# Patient Record
Sex: Male | Born: 2000 | Race: Black or African American | Hispanic: No | Marital: Single | Smoking: Current every day smoker
Health system: Southern US, Community
[De-identification: ages and names within clinical notes are randomized; demographics above are authoritative.]

## PROBLEM LIST (undated history)

## (undated) DIAGNOSIS — J45909 Unspecified asthma, uncomplicated: Secondary | ICD-10-CM

## (undated) HISTORY — DX: Unspecified asthma, uncomplicated: J45.909

---
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
## 2020-08-19 ENCOUNTER — Other Ambulatory Visit: Payer: Self-pay

## 2020-08-19 DIAGNOSIS — R110 Nausea: Secondary | ICD-10-CM | POA: Insufficient documentation

## 2020-08-19 DIAGNOSIS — R1010 Upper abdominal pain, unspecified: Secondary | ICD-10-CM | POA: Diagnosis not present

## 2020-08-19 DIAGNOSIS — F17200 Nicotine dependence, unspecified, uncomplicated: Secondary | ICD-10-CM | POA: Insufficient documentation

## 2020-08-19 LAB — COMPREHENSIVE METABOLIC PANEL
ALT: 16 U/L (ref 0–44)
AST: 27 U/L (ref 15–41)
Albumin: 4.3 g/dL (ref 3.5–5.0)
Alkaline Phosphatase: 41 U/L (ref 38–126)
Anion gap: 7 (ref 5–15)
BUN: 15 mg/dL (ref 6–20)
CO2: 27 mmol/L (ref 22–32)
Calcium: 8.9 mg/dL (ref 8.9–10.3)
Chloride: 106 mmol/L (ref 98–111)
Creatinine, Ser: 1.05 mg/dL (ref 0.61–1.24)
GFR, Estimated: >60 mL/min (ref >60)
Glucose, Bld: 87 mg/dL (ref 70–99)
Potassium: 3.5 mmol/L (ref 3.5–5.1)
Sodium: 140 mmol/L (ref 135–145)
Total Bilirubin: 1 mg/dL (ref 0.3–1.2)
Total Protein: 7.3 g/dL (ref 6.5–8.1)

## 2020-08-19 LAB — CBC
HCT: 41.6 % (ref 39.0–52.0)
Hemoglobin: 14.4 g/dL (ref 13.0–17.0)
MCH: 31.6 pg (ref 26.0–34.0)
MCHC: 34.6 g/dL (ref 30.0–36.0)
MCV: 91.4 fL (ref 80.0–100.0)
Platelets: 199 10*3/uL (ref 150–400)
RBC: 4.55 MIL/uL (ref 4.22–5.81)
RDW: 11.5 % (ref 11.5–15.5)
WBC: 5 10*3/uL (ref 4.0–10.5)
nRBC: 0 % (ref 0.0–0.2)

## 2020-08-19 LAB — LIPASE, BLOOD: Lipase: 53 U/L — ABNORMAL HIGH (ref 11–51)

## 2020-08-19 NOTE — ED Triage Notes (Signed)
C/o upper sharp intermittent abd pain x2days, denies n/v with loose stool, 1 dark stool today. Reports hx hemorrhoids that feels similar to this pain.

## 2020-08-20 ENCOUNTER — Emergency Department: Payer: Managed Care, Other (non HMO)

## 2020-08-20 ENCOUNTER — Emergency Department
Admission: EM | Admit: 2020-08-20 | Discharge: 2020-08-20 | Discharge status: Against Medical Advice | Payer: Managed Care, Other (non HMO) | Attending: Emergency Medicine | Admitting: Emergency Medicine

## 2020-08-20 DIAGNOSIS — R1010 Upper abdominal pain, unspecified: Secondary | ICD-10-CM

## 2020-08-20 LAB — URINALYSIS, COMPLETE (UACMP) WITH MICROSCOPIC
Bacteria, UA: NONE SEEN
Bilirubin Urine: NEGATIVE
Glucose, UA: NEGATIVE mg/dL
Hgb urine dipstick: NEGATIVE
Ketones, ur: 5 mg/dL — AB
Leukocytes,Ua: NEGATIVE
Nitrite: NEGATIVE
Protein, ur: NEGATIVE mg/dL
Specific Gravity, Urine: 1.026 (ref 1.005–1.030)
Squamous Epithelial / LPF: NONE SEEN (ref 0–5)
pH: 6 (ref 5.0–8.0)

## 2020-08-20 NOTE — ED Notes (Signed)
Received call from par that pt eloped after registration, provider aware

## 2020-08-20 NOTE — ED Notes (Signed)
Pt updated on wait. Pt verbalizes understanding.  

## 2020-08-20 NOTE — ED Provider Notes (Signed)
[facility] Emergency Department Provider Note  ____________________________________________   Event Date/Time   First MD Initiated Contact with Patient 08/20/20 0110     (approximate)  I have reviewed the triage vital signs and the nursing notes.   HISTORY  Chief Complaint Abdominal Pain    HPI Peter Savage is a 20 y.o. male with no significant past medical history who presents to the emergency department with 2 days of intermittent sharp upper abdominal pain without radiation and without aggravating or alleviating factors.  He reports nausea but no fevers, vomiting, diarrhea.  Did have 1 black stool yesterday but none since.  No bloody stools.  No history of abdominal surgery.  Does not drink alcohol.  Does use NSAIDs occasionally.  Has not been taking Pepto-Bismol.  No dysuria, hematuria, penile discharge.  Denies having any pain currently.        No past medical history on file.  There are no problems to display for this patient.   No past surgical history on file.  Prior to Admission medications   Not on File    Allergies Patient has no known allergies.  Family History  Problem Relation Age of Onset  . Healthy Mother   . Diabetes Father   . Heart attack Maternal Grandmother   . Hypertension Maternal Grandmother   . Thyroid disease Maternal Grandfather   . Hypertension Maternal Grandfather     Social History Social History   Tobacco Use  . Smoking status: Current Every Day Smoker    Last attempt to quit: 07/29/2017    Years since quitting: 3.0  . Smokeless tobacco: Never Used  . Tobacco comment: smoked for 7 months  Vaping Use  . Vaping Use: Every day  Substance Use Topics  . Alcohol use: Never  . Drug use: Yes    Types: Marijuana    Review of Systems Constitutional: No fever. Eyes: No visual changes. ENT: No sore throat. Cardiovascular: Denies chest pain. Respiratory: Denies shortness of  breath. Gastrointestinal: No vomiting, diarrhea. Genitourinary: Negative for dysuria. Musculoskeletal: Negative for back pain. Skin: Negative for rash. Neurological: Negative for focal weakness or numbness.  ____________________________________________   PHYSICAL EXAM:  VITAL SIGNS: ED Triage Vitals [08/19/20 2014]  Enc Vitals Group     BP (!) 135/112     Pulse Rate (!) 55     Resp 16     Temp 98.3 F (36.8 C)     Temp Source Oral     SpO2 100 %     Weight 170 lb (77.1 kg)     Height 5\' 8"  (1.727 m)     Head Circumference      Peak Flow      Pain Score 7     Pain Loc      Pain Edu?      Excl. in GC?    CONSTITUTIONAL: Alert and oriented and responds appropriately to questions. Well-appearing; well-nourished HEAD: Normocephalic EYES: Conjunctivae clear, pupils appear equal, EOM appear intact ENT: normal nose; moist mucous membranes NECK: Supple, normal ROM CARD: RRR; S1 and S2 appreciated; no murmurs, no clicks, no rubs, no gallops RESP: Normal chest excursion without splinting or tachypnea; breath sounds clear and equal bilaterally; no wheezes, no rhonchi, no rales, no hypoxia or respiratory distress, speaking full sentences ABD/GI: Normal bowel sounds; non-distended; soft, non-tender, no rebound, no guarding, no peritoneal signs, no hepatosplenomegaly, negative Murphy sign but points to his right upper quadrant as the area that his pain  was located RECTAL:  Normal rectal tone, no gross blood or melena, guaiac NEGATIVE, no hemorrhoids appreciated, nontender rectal exam, no fecal impaction. Chaperone present. BACK: The back appears normal EXT: Normal ROM in all joints; no deformity noted, no edema; no cyanosis SKIN: Normal color for age and race; warm; no rash on exposed skin NEURO: Moves all extremities equally PSYCH: The patient's mood and manner are appropriate.  ____________________________________________   LABS (all labs ordered are listed, but only abnormal  results are displayed)  Labs Reviewed  LIPASE, BLOOD - Abnormal; Notable for the following components:      Result Value   Lipase 53 (*)    All other components within normal limits  URINALYSIS, COMPLETE (UACMP) WITH MICROSCOPIC - Abnormal; Notable for the following components:   Color, Urine YELLOW (*)    APPearance CLEAR (*)    Ketones, ur 5 (*)    All other components within normal limits  COMPREHENSIVE METABOLIC PANEL  CBC   ____________________________________________  EKG  none ____________________________________________  RADIOLOGY I, Nyomie Ehrlich, personally viewed and evaluated these images (plain radiographs) as part of my medical decision making, as well as reviewing the written report by the radiologist.  ED MD interpretation: Right upper quadrant ultrasound normal.  Official radiology report(s): US Abdomen Limited RUQ (LIVER/GB)  Result Date: 08/20/2020 CLINICAL DATA:  Upper abdominal pain EXAM: ULTRASOUND ABDOMEN LIMITED RIGHT UPPER QUADRANT COMPARISON:  None. FINDINGS: Gallbladder: No gallstones or wall thickening visualized. No sonographic Murphy sign noted by sonographer. Common bile duct: Diameter: Normal caliber, 3 mm Liver: No focal lesion identified. Within normal limits in parenchymal echogenicity. Portal vein is patent on color Doppler imaging with normal direction of blood flow towards the liver. Other: None. IMPRESSION: Normal right upper quadrant ultrasound. Electronically Signed   By: Charlett Nose M.D.   On: 08/20/2020 02:03    ____________________________________________   PROCEDURES  Procedure(s) performed (including Critical Care):  Procedures   ____________________________________________   INITIAL IMPRESSION / ASSESSMENT AND PLAN / ED COURSE  As part of my medical decision making, I reviewed the following data within the electronic MEDICAL RECORD NUMBER Nursing notes reviewed and incorporated, Labs reviewed , Old chart reviewed and Notes from  prior ED visits         Patient here with upper abdominal pain.  Differential includes GERD, gastritis, gastric ulcer, GI bleed, H. pylori, biliary colic, cholecystitis, cholangitis, choledocholithiasis, pancreatitis.  Doubt appendicitis.  Labs obtained in triage are reassuring.  Very minimally elevated lipase but normal LFTs.  No leukocytosis.  Urine pending.  Will obtain right upper quadrant ultrasound.  He declines any medications at this time.  He is guaiac negative on exam with brown appearing stool.  ED PROGRESS  Patient's ultrasound was normal.  I went back to reevaluate patient and discuss his test results with him and staff states that he eloped after being seen by registration.  I was not able to provide him with his test results, reassess him, provide him with follow-up.  I reviewed all nursing notes and pertinent previous records as available.  I have reviewed and interpreted any EKGs, lab and urine results, imaging (as available).  ____________________________________________   FINAL CLINICAL IMPRESSION(S) / ED DIAGNOSES  Final diagnoses:  Upper abdominal pain     ED Discharge Orders    None      *Please note:  Taiden Raybourn was evaluated in Emergency Department on 08/20/2020 for the symptoms described in the history of present illness. He was evaluated in  the context of the global COVID-19 pandemic, which necessitated consideration that the patient might be at risk for infection with the SARS-CoV-2 virus that causes COVID-19. Institutional protocols and algorithms that pertain to the evaluation of patients at risk for COVID-19 are in a state of rapid change based on information released by regulatory bodies including the CDC and federal and state organizations. These policies and algorithms were followed during the patient's care in the ED.  Some ED evaluations and interventions may be delayed as a result of limited staffing during and the pandemic.*   Note:  This  document was prepared using Dragon voice recognition software and may include unintentional dictation errors.   Carlethia Mesquita, Layla Maw, DO 08/20/20 [phone]

## 2021-12-15 ENCOUNTER — Emergency Department: Payer: Self-pay

## 2021-12-15 ENCOUNTER — Other Ambulatory Visit: Payer: Self-pay

## 2021-12-15 ENCOUNTER — Emergency Department
Admission: EM | Admit: 2021-12-15 | Discharge: 2021-12-15 | Disposition: A | Discharge status: Home / Self Care | Payer: Self-pay | Attending: Emergency Medicine | Admitting: Emergency Medicine

## 2021-12-15 ENCOUNTER — Encounter: Payer: Self-pay | Admitting: Emergency Medicine

## 2021-12-15 DIAGNOSIS — Z23 Encounter for immunization: Secondary | ICD-10-CM | POA: Insufficient documentation

## 2021-12-15 DIAGNOSIS — W25XXXA Contact with sharp glass, initial encounter: Secondary | ICD-10-CM | POA: Insufficient documentation

## 2021-12-15 DIAGNOSIS — S61412A Laceration without foreign body of left hand, initial encounter: Secondary | ICD-10-CM | POA: Insufficient documentation

## 2021-12-15 MED ORDER — LIDOCAINE HCL (PF) 1 % IJ SOLN
10.0000 mL | Freq: Once | INTRAMUSCULAR | Status: AC
  Administered 2021-12-15: 10 mL
  Filled 2021-12-15: qty 10

## 2021-12-15 MED ORDER — TETANUS-DIPHTH-ACELL PERTUSSIS 5-2.5-18.5 LF-MCG/0.5 IM SUSY
0.5000 mL | PREFILLED_SYRINGE | Freq: Once | INTRAMUSCULAR | Status: AC
  Administered 2021-12-15: 0.5 mL via INTRAMUSCULAR
  Filled 2021-12-15: qty 0.5

## 2021-12-15 NOTE — Discharge Instructions (Addendum)
Please take Tylenol and ibuprofen/Advil for your pain.  It is safe to take them together, or to alternate them every few hours.  Take up to 1000mg  of Tylenol at a time, up to 4 times per day.  Do not take more than 4000 mg of Tylenol in 24 hours.  For ibuprofen, take 400-600 mg, 3 - 4 times per day.  Gently wash the wound with soap and water.  It is okay to shower, but do not submerge in a bath or go swimming as it is healing.  Do not vigorously scrub.   Gently pat dry.   Once dry, then apply Neosporin or bacitracin or even Vaseline ointment to the area to act as a barrier to help prevent infection.  The stitches will need to be removed after 7-10 days.  This can be done at any doctor's office, urgent care or emergency room.

## 2021-12-15 NOTE — ED Provider Notes (Signed)
[facility] Provider Note    Event Date/Time   First MD Initiated Contact with Patient 12/15/21 [phone]     (approximate)   History   Laceration   HPI  Peter Savage is a 21 y.o. male who presents to the ED for evaluation of Laceration   Patient presents to the ED for evaluation of an accidental laceration to the thenar eminence of his left hand.  He reports he was picking up a trash bag from the bottom that had a shard of glass within the bag that accidentally cut his hand.  Physical Exam   Triage Vital Signs: ED Triage Vitals  Enc Vitals Group     BP 12/15/21 0201 122/76     Pulse Rate 12/15/21 0201 (!) 53     Resp 12/15/21 0201 17     Temp 12/15/21 0201 98.7 F (37.1 C)     Temp Source 12/15/21 0201 Oral     SpO2 12/15/21 0201 98 %     Weight 12/15/21 0206 160 lb (72.6 kg)     Height 12/15/21 0206 5\' 8"  (1.727 m)     Head Circumference --      Peak Flow --      Pain Score 12/15/21 0206 8     Pain Loc --      Pain Edu? --      Excl. in GC? --     Most recent vital signs: Vitals:   12/15/21 0201 12/15/21 0633  BP: 122/76 131/82  Pulse: (!) 53 60  Resp: 17 18  Temp: 98.7 F (37.1 C)   SpO2: 98% 99%    General: Awake, no distress.  CV:  Good peripheral perfusion.  Resp:  Normal effort.  Abd:  No distention.  MSK:  4 cm obliquely oriented laceration to the thenar eminence of the left hand.  Thumb flexion, extension rotation is intact.  With inspection, there is partial transection of the muscle belly of likely the abductor pollicis brevis. Neuro:  No focal deficits appreciated. Other:       ED Results / Procedures / Treatments   Labs (all labs ordered are listed, but only abnormal results are displayed) Labs Reviewed - No data to display  EKG   RADIOLOGY Plain film of the left hand interpreted by me without evidence of foreign body, fracture or dislocation.  Official radiology report(s): DG Hand Complete  Left  Result Date: 12/15/2021 CLINICAL DATA:  Recent left hand laceration initial encounter EXAM: LEFT HAND - COMPLETE 3+ VIEW COMPARISON:  None Available. FINDINGS: Soft tissue irregularity consistent with the given clinical history is noted. Dressing is noted in place. No acute fracture or dislocation is noted. No soft tissue abnormality is seen. IMPRESSION: Soft tissue changes consistent with the given clinical history. No acute bony abnormality is noted. Electronically Signed   By: 12/17/2021 M.D.   On: 12/15/2021 03:24    PROCEDURES and INTERVENTIONS:  .08/15/2023Laceration Repair  Date/Time: 12/15/2021 7:08 AM  Performed by: 12/17/2021, MD Authorized by: Delton Prairie, MD   Consent:    Consent obtained:  Verbal   Consent given by:  Patient   Risks, benefits, and alternatives were discussed: yes   Anesthesia:    Anesthesia method:  Local infiltration   Local anesthetic:  Lidocaine 1% w/o epi Laceration details:    Location:  Hand   Hand location:  L palm   Length (cm):  4 Pre-procedure details:    Preparation:  Patient was  prepped and draped in usual sterile fashion Exploration:    Imaging obtained: x-ray     Imaging outcome: foreign body not noted     Wound exploration: wound explored through full range of motion and entire depth of wound visualized     Wound extent: muscle damage     Contaminated: no   Treatment:    Area cleansed with:  Povidone-iodine   Amount of cleaning:  Standard   Irrigation solution:  Sterile saline   Visualized foreign bodies/material removed: no     Debridement:  None   Undermining:  None   Scar revision: no     Layers/structures repaired:  Muscle belly Muscle belly:    Suture size:  5-0   Suture material:  Monocryl   Suture technique:  Horizontal mattress   Number of sutures:  1 Skin repair:    Repair method:  Sutures   Suture size:  3-0   Suture material:  Nylon   Suture technique:  Horizontal mattress and simple interrupted   Number of  sutures:  3 (1 horizontal mattress and 2 simple interrupted's.) Approximation:    Approximation:  Close Repair type:    Repair type:  Intermediate Post-procedure details:    Dressing:  Antibiotic ointment and sterile dressing   Procedure completion:  Tolerated well, no immediate complications   Medications  lidocaine (PF) (XYLOCAINE) 1 % injection 10 mL (10 mLs Infiltration Given by Other 12/15/21 0514)  Tdap (BOOSTRIX) injection 0.5 mL (0.5 mLs Intramuscular Given 12/15/21 7035)     IMPRESSION / MDM / ASSESSMENT AND PLAN / ED COURSE  I reviewed the triage vital signs and the nursing notes.  Differential diagnosis includes, but is not limited to, tendon disruption, fracture, dislocation, retained foreign body  21 year old male presents to the ED after an accidental laceration to the palm of the left hand requiring bedside repair.  X-ray without evidence of bony involvement or foreign body.  He does seem to have partial involvements of underlying muscle belly, likely of the abductor pollicis brevis.  His range of motion and function seems to be intact before and after I repair this muscle belly and then the skin.  We discussed wound care, suture removal timeframe and return precautions.     FINAL CLINICAL IMPRESSION(S) / ED DIAGNOSES   Final diagnoses:  Laceration of left hand without foreign body, initial encounter     Rx / DC Orders   ED Discharge Orders     None        Note:  This document was prepared using Dragon voice recognition software and may include unintentional dictation errors.   Delton Prairie, MD 12/15/21 [phone]

## 2021-12-15 NOTE — ED Notes (Signed)
Laceration cleansed with sterile water and iodine.

## 2021-12-15 NOTE — ED Notes (Signed)
Patient discharged at this time. Ambulated to lobby with independent and steady gait. Breathing unlabored speaking in full sentences. Verbalized understanding of all discharge, follow up, and medication teaching. Discharged homed with all belongings.   

## 2021-12-15 NOTE — ED Triage Notes (Signed)
Pt to ED via POV, pt with approx 1in laceration across the anterior surface of the palm of his L hand proximal to the thumb. Pt states reached under a trash bag and cut his hand on a piece of broken glass. Pt denies numbness when touched, able to move his thumb. Bleeding controlled on arrival. Clean bandage placed by this RN.

## 2021-12-24 ENCOUNTER — Ambulatory Visit
Admission: RE | Admit: 2021-12-24 | Discharge: 2021-12-24 | Disposition: A | Discharge status: Home / Self Care | Payer: Self-pay | Source: Ambulatory Visit | Attending: Family Medicine | Admitting: Family Medicine

## 2021-12-24 DIAGNOSIS — Z4802 Encounter for removal of sutures: Secondary | ICD-10-CM

## 2021-12-24 DIAGNOSIS — S61412D Laceration without foreign body of left hand, subsequent encounter: Secondary | ICD-10-CM

## 2021-12-24 NOTE — ED Triage Notes (Signed)
Pt is here for a suture removal in the left hand. Pt had 3 sutures placed on 12/15/21.   3 Sutures were removed by Annice Pih, CMA.  Pt voiced no concerns or discomfort at time of removal.

## 2022-08-28 IMAGING — US US ABDOMEN LIMITED RUQ/ASCITES
1 series · 14 of 25 positions shown · non-contrast
Comparison: None.

CLINICAL DATA: Upper abdominal pain

EXAM:
ULTRASOUND ABDOMEN LIMITED RIGHT UPPER QUADRANT

[Series 1: us abdomen limited ruq (liver/gb) · 14 of 26 slices shown]
[im 1/26]
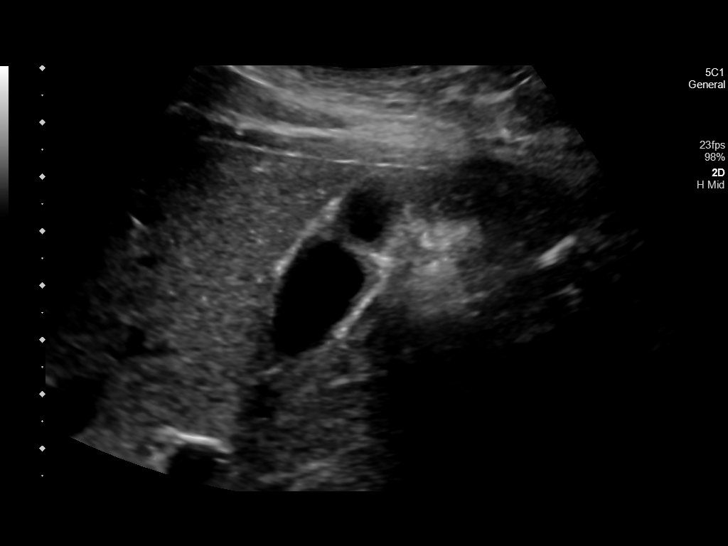
[im 3/26]
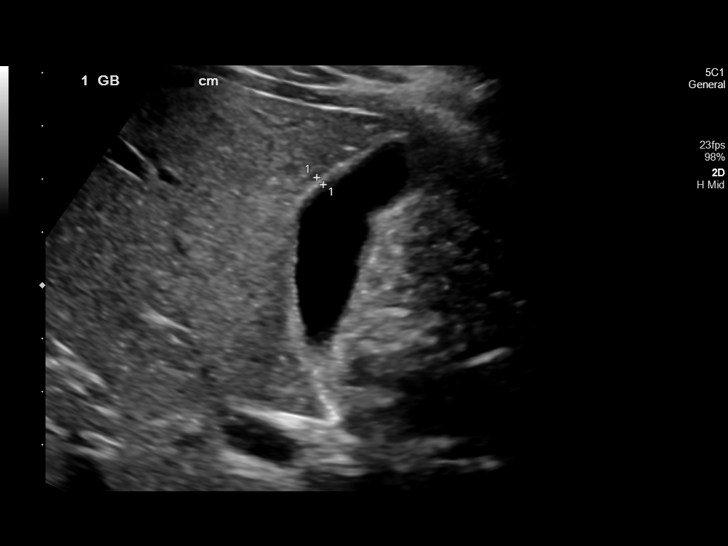
[im 5/26]
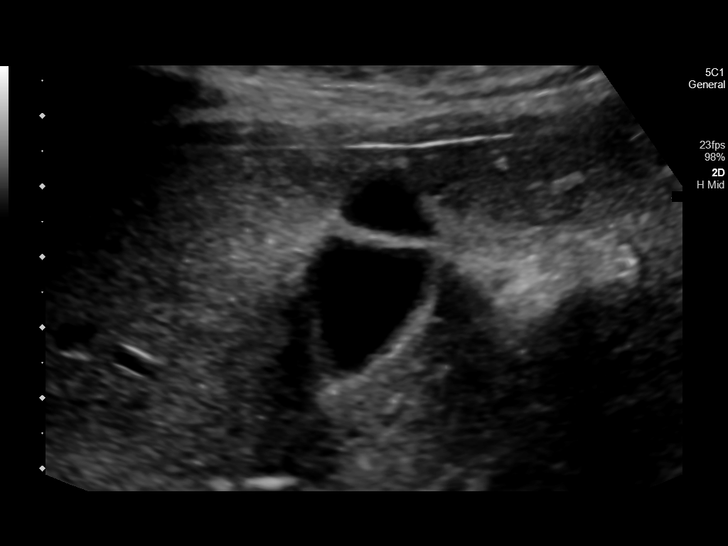
[im 7/26]
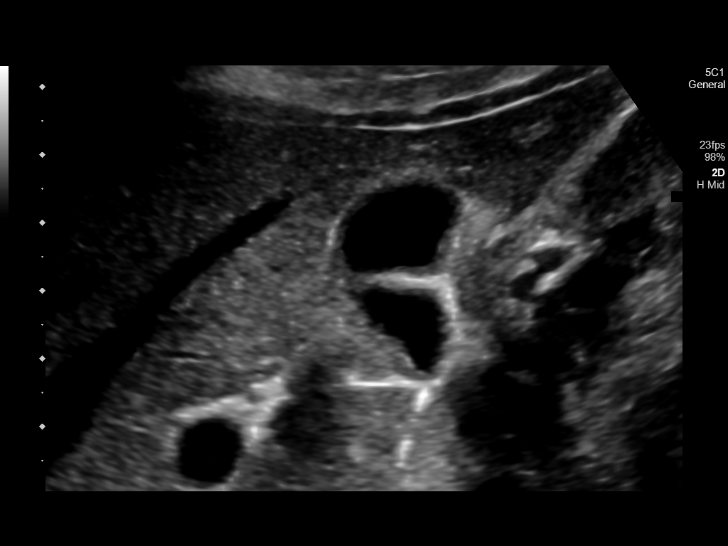
[im 9/26]
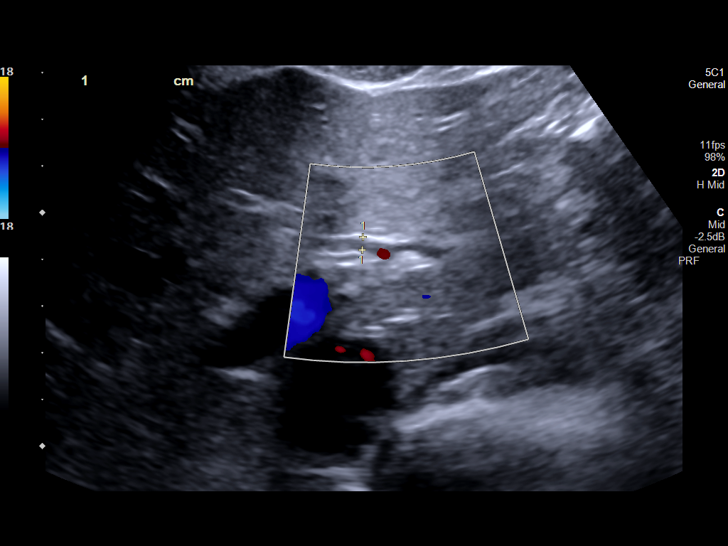
[im 10/26]
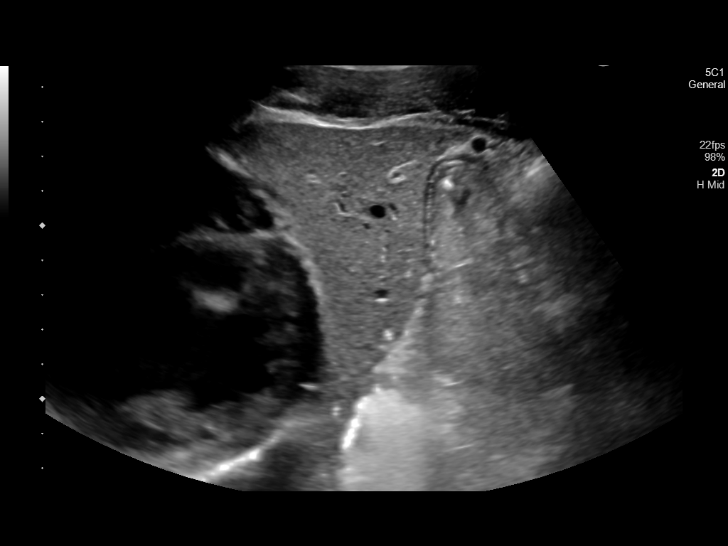
[im 12/26]
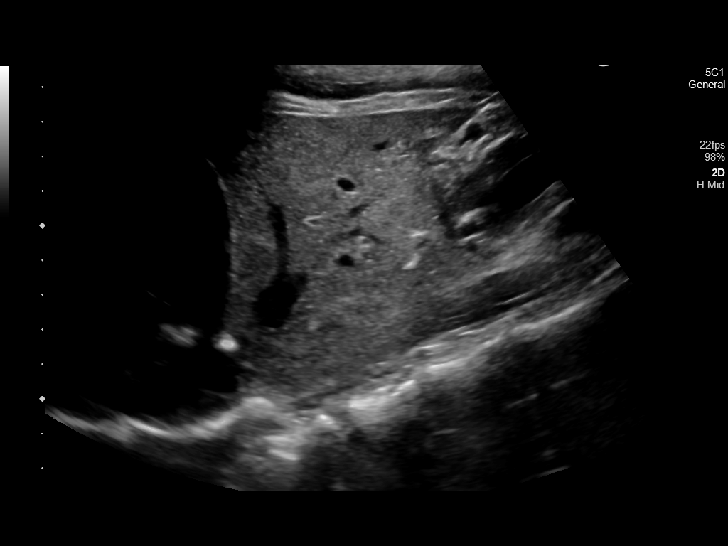
[im 14/26]
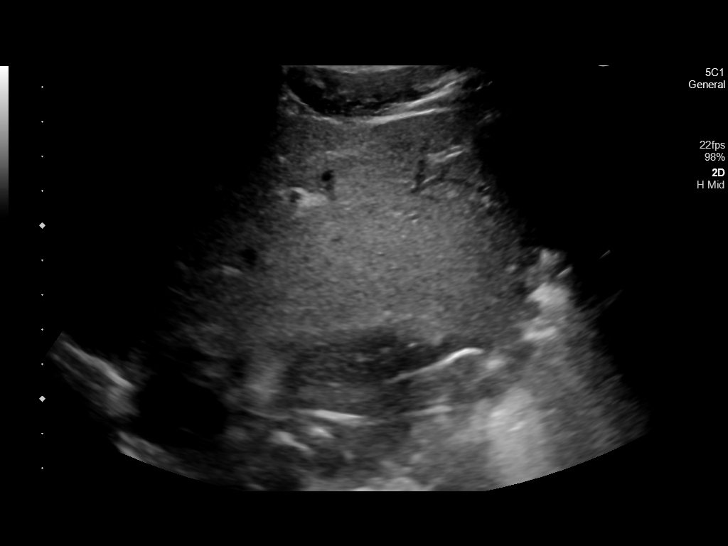
[im 16/26]
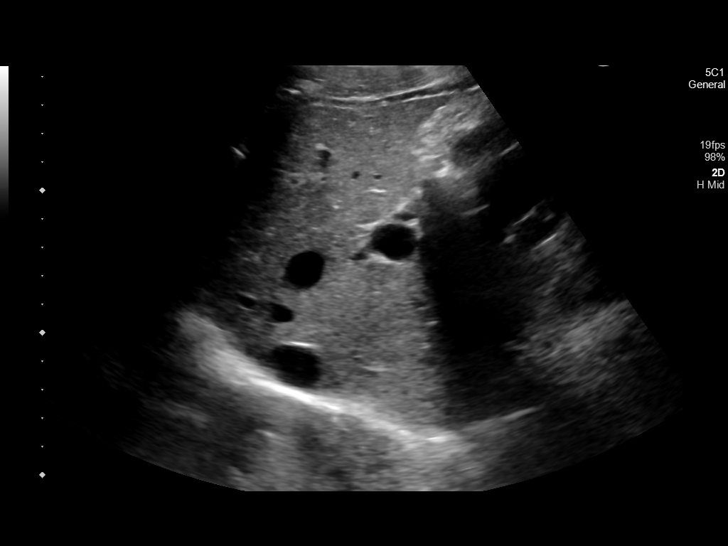
[im 17/26]
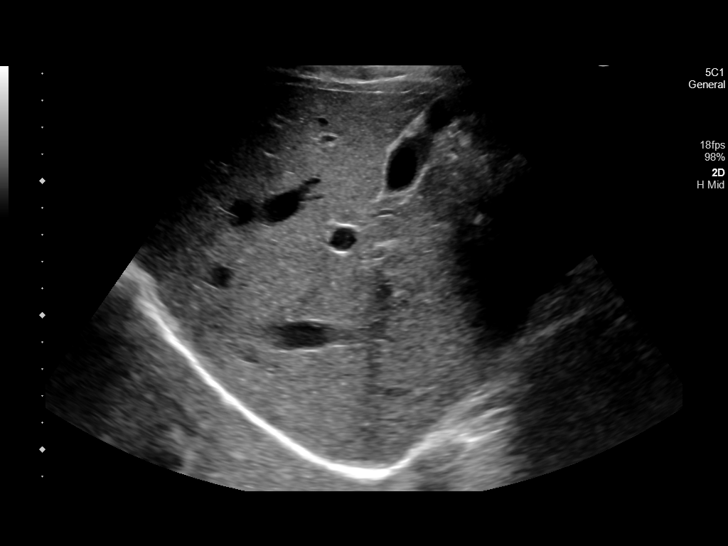
[im 19/26]
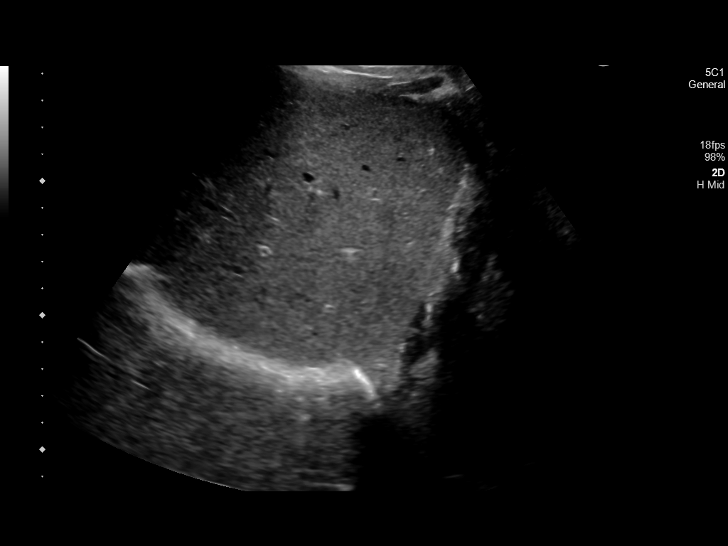
[im 21/26]
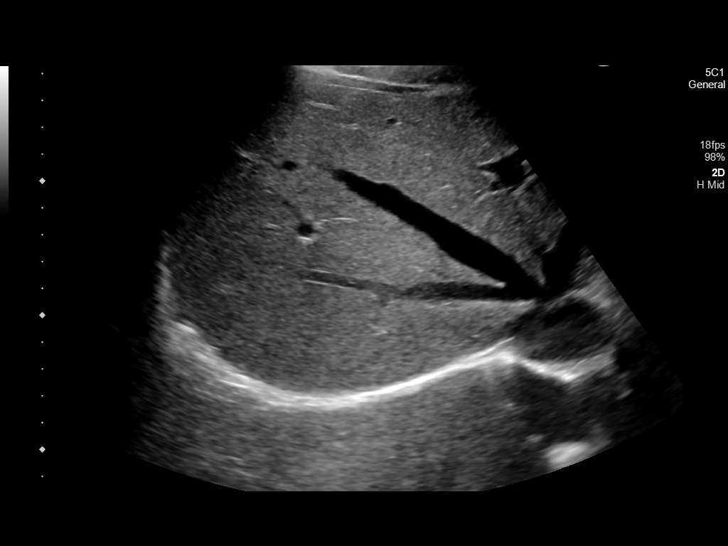
[im 23/26]
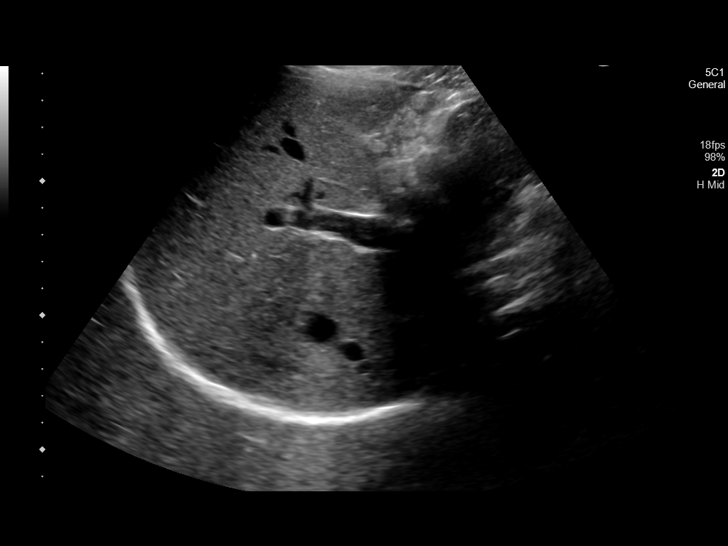
[im 26/26]
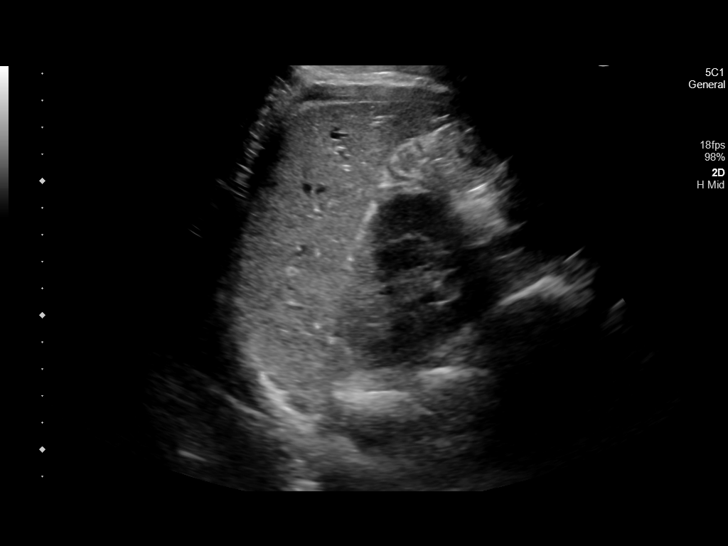

[14 of 25 positions shown; findings below may reference images not displayed]

FINDINGS: Gallbladder:

No gallstones or wall thickening visualized. No sonographic Murphy
sign noted by sonographer.

Common bile duct:

Diameter: Normal caliber, 3 mm

Liver:

No focal lesion identified. Within normal limits in parenchymal
echogenicity. Portal vein is patent on color Doppler imaging with
normal direction of blood flow towards the liver.

Other: None.
IMPRESSION: Normal right upper quadrant ultrasound.
